# Patient Record
Sex: Male | Born: 1970
Health system: Southern US, Community
[De-identification: ages and names within clinical notes are randomized; demographics above are authoritative.]

## PROBLEM LIST (undated history)

## (undated) DIAGNOSIS — R079 Chest pain, unspecified: Secondary | ICD-10-CM

## (undated) HISTORY — PX: ROTATOR CUFF REPAIR: SHX139

## (undated) HISTORY — DX: Chest pain, unspecified: R07.9

---

## 2015-10-29 DIAGNOSIS — E7800 Pure hypercholesterolemia, unspecified: Secondary | ICD-10-CM

## 2015-10-29 DIAGNOSIS — E785 Hyperlipidemia, unspecified: Secondary | ICD-10-CM | POA: Insufficient documentation

## 2015-10-29 HISTORY — DX: Hyperlipidemia, unspecified: E78.5

## 2015-10-29 HISTORY — DX: Pure hypercholesterolemia, unspecified: E78.00

## 2018-04-22 ENCOUNTER — Emergency Department (HOSPITAL_BASED_OUTPATIENT_CLINIC_OR_DEPARTMENT_OTHER)
Admission: EM | Admit: 2018-04-22 | Discharge: 2018-04-22 | Disposition: A | Discharge status: Home / Self Care | Payer: Self-pay | Attending: Emergency Medicine | Admitting: Emergency Medicine

## 2018-04-22 ENCOUNTER — Encounter (HOSPITAL_BASED_OUTPATIENT_CLINIC_OR_DEPARTMENT_OTHER): Payer: Self-pay | Admitting: Emergency Medicine

## 2018-04-22 ENCOUNTER — Other Ambulatory Visit: Payer: Self-pay

## 2018-04-22 DIAGNOSIS — J069 Acute upper respiratory infection, unspecified: Secondary | ICD-10-CM | POA: Insufficient documentation

## 2018-04-22 DIAGNOSIS — B9789 Other viral agents as the cause of diseases classified elsewhere: Secondary | ICD-10-CM

## 2018-04-22 MED ORDER — AMOXICILLIN 500 MG PO CAPS: 500.0000 mg | ORAL_CAPSULE | Freq: Three times a day (TID) | ORAL | 0 refills | Status: AC

## 2018-04-22 MED ORDER — FEXOFENADINE-PSEUDOEPHED ER 60-120 MG PO TB12: 1.0000 | ORAL_TABLET | Freq: Two times a day (BID) | ORAL | 0 refills | Status: AC

## 2018-04-22 MED FILL — ALLEGRA-D 12 HOUR TABLET: 60-120 | 15 days supply | Qty: 30 | Fill #0

## 2018-04-22 MED FILL — AMOXICILLIN 500 MG CAPSULE: 500 | 7 days supply | Qty: 21 | Fill #0

## 2018-04-22 NOTE — ED Provider Notes (Signed)
[facility] EMERGENCY DEPARTMENT Provider Note   CSN: [id number] Arrival date & time: 04/22/18  0850     History   Chief Complaint Chief Complaint  Patient presents with  . Cough    HPI Ronnie Arellano is a 47 y.o. male.  HPI   47 year old male with cough and congestion.  Symptom onset Friday.  Persistent since then.  Is been taken Mucinex with no improvement.  He feels like he cannot get up the stuff in his chest.  No fevers or chills.  Some facial pressure.  No unusual leg pain or swelling.  No orthopnea.  History reviewed. No pertinent past medical history.  There are no active problems to display for this patient.      Home Medications    Prior to Admission medications   Medication Sig Start Date End Date Taking? Authorizing Provider  atorvastatin (LIPITOR) 10 MG tablet Take 10 mg by mouth daily.   Yes [provider]    Family History History reviewed. No pertinent family history.  Social History Social History   Tobacco Use  . Smoking status: Not on file  Substance Use Topics  . Alcohol use: Not on file  . Drug use: Not on file     Allergies   Patient has no known allergies.   Review of Systems Review of Systems All systems reviewed and negative, other than as noted in HPI.   Physical Exam Updated Vital Signs BP (!) 143/101 (BP Location: Right Arm)   Pulse 73   Temp 97.8 F (36.6 C) (Oral)   Resp 16   Ht  (1.778 m)   Wt 95.3 kg (210 lb)   SpO2 100%   BMI 30.13 kg/m   Physical Exam  Constitutional: He appears well-developed and well-nourished. No distress.  HENT:  Head: Normocephalic and atraumatic.  Eyes: Conjunctivae are normal. Right eye exhibits no discharge. Left eye exhibits no discharge.  Neck: Neck supple.  Cardiovascular: Normal rate, regular rhythm and normal heart sounds. Exam reveals no gallop and no friction rub.  No murmur heard. Pulmonary/Chest: Effort normal and breath sounds normal. No  respiratory distress.  Abdominal: Soft. He exhibits no distension. There is no tenderness.  Musculoskeletal: He exhibits no edema or tenderness.  Neurological: He is alert.  Skin: Skin is warm and dry.  Psychiatric: He has a normal mood and affect. His behavior is normal. Thought content normal.  Nursing note and vitals reviewed.    ED Treatments / Results  Labs (all labs ordered are listed, but only abnormal results are displayed) Labs Reviewed - No data to display  EKG None  Radiology No results found.  Procedures Procedures (including critical care time)  Medications Ordered in ED Medications - No data to display   Initial Impression / Assessment and Plan / ED Course  I have reviewed the triage vital signs and the nursing notes.  Pertinent labs & imaging results that were available during my care of the patient were reviewed by me and considered in my medical decision making (see chart for details).     47 year old male with likely viral URI.  He is afebrile.  Generally well-appearing.  Lungs are clear.  Oxygen saturation 90% on room air.  He has no increased work of breathing.  I doubt emergent process such as serious bacterial infection, PE, etc.  Plan symptomatic treatment.  Patient is concerned that he needs an antibiotic " to just go ahead and cleared it up." Advised I suspect  viral and no role for antibiotics. Encouraged to not fill unless symptoms do not begin to improve in another 3-4 days. Return precautions discussed  Final Clinical Impressions(s) / ED Diagnoses   Final diagnoses:  Viral URI with cough    ED Discharge Orders    None       Raeford Razor, MD 04/27/18 [phone]

## 2018-04-22 NOTE — ED Triage Notes (Signed)
Patient c/o cough and chest congestion since Friday.   

## 2018-08-03 ENCOUNTER — Emergency Department (HOSPITAL_BASED_OUTPATIENT_CLINIC_OR_DEPARTMENT_OTHER): Payer: BLUE CROSS/BLUE SHIELD

## 2018-08-03 ENCOUNTER — Emergency Department (HOSPITAL_BASED_OUTPATIENT_CLINIC_OR_DEPARTMENT_OTHER)
Admission: EM | Admit: 2018-08-03 | Discharge: 2018-08-03 | Disposition: A | Discharge status: Home / Self Care | Payer: BLUE CROSS/BLUE SHIELD | Attending: Emergency Medicine | Admitting: Emergency Medicine

## 2018-08-03 ENCOUNTER — Encounter (HOSPITAL_BASED_OUTPATIENT_CLINIC_OR_DEPARTMENT_OTHER): Payer: Self-pay | Admitting: Adult Health

## 2018-08-03 ENCOUNTER — Other Ambulatory Visit: Payer: Self-pay

## 2018-08-03 DIAGNOSIS — S59901A Unspecified injury of right elbow, initial encounter: Secondary | ICD-10-CM | POA: Diagnosis not present

## 2018-08-03 DIAGNOSIS — Z79899 Other long term (current) drug therapy: Secondary | ICD-10-CM | POA: Diagnosis not present

## 2018-08-03 DIAGNOSIS — Y999 Unspecified external cause status: Secondary | ICD-10-CM | POA: Diagnosis not present

## 2018-08-03 DIAGNOSIS — W010XXA Fall on same level from slipping, tripping and stumbling without subsequent striking against object, initial encounter: Secondary | ICD-10-CM | POA: Diagnosis not present

## 2018-08-03 DIAGNOSIS — S46391A Other injury of muscle, fascia and tendon of triceps, right arm, initial encounter: Secondary | ICD-10-CM | POA: Insufficient documentation

## 2018-08-03 DIAGNOSIS — Y9301 Activity, walking, marching and hiking: Secondary | ICD-10-CM | POA: Diagnosis not present

## 2018-08-03 DIAGNOSIS — Y929 Unspecified place or not applicable: Secondary | ICD-10-CM | POA: Diagnosis not present

## 2018-08-03 DIAGNOSIS — S40921A Unspecified superficial injury of right upper arm, initial encounter: Secondary | ICD-10-CM | POA: Diagnosis present

## 2018-08-03 DIAGNOSIS — S46301A Unspecified injury of muscle, fascia and tendon of triceps, right arm, initial encounter: Secondary | ICD-10-CM

## 2018-08-03 MED ORDER — MELOXICAM 15 MG PO TABS: 15.0000 mg | ORAL_TABLET | Freq: Every day | ORAL | 0 refills | Status: DC

## 2018-08-03 NOTE — ED Triage Notes (Signed)
PResents with right arm pain that occurred after slipping and hitting his right arm on the wood part of a bridge while playing golf this AM. HE states the pain is from the tricep to the elbow and worse with lifting arm.

## 2018-08-03 NOTE — ED Provider Notes (Signed)
[facility] EMERGENCY DEPARTMENT Provider Note   CSN: [id number] Arrival date & time: 08/03/18  1216     History   Chief Complaint Chief Complaint  Patient presents with  . Arm Pain    HPI Ronnie Arellano is a 47 y.o. male presents the emergency department chief complaint of elbow injury.  Patient states that he was walking on a bridge when his feet slipped out from under him and he landed on the right side slipped banging his elbow.  Patient had immediate pain and swelling and complains of significant pain in the tricep.  He is able to move his arm however has severe pain.  He complains of some numbness in all of his fingers.  He has no previous injuries to this area.  HPI  History reviewed. No pertinent past medical history.  There are no active problems to display for this patient.   History reviewed. No pertinent surgical history.      Home Medications    Prior to Admission medications   Medication Sig Start Date End Date Taking? Authorizing Provider  amoxicillin (AMOXIL) 500 MG capsule Take 1 capsule (500 mg total) by mouth 3 (three) times daily. 04/22/18   Raeford Razor, MD  atorvastatin (LIPITOR) 10 MG tablet Take 10 mg by mouth daily.    [provider]  fexofenadine-pseudoephedrine (ALLEGRA-D) 60-120 MG 12 hr tablet Take 1 tablet by mouth every 12 (twelve) hours. 04/22/18   Raeford Razor, MD  meloxicam (MOBIC) 15 MG tablet Take 1 tablet (15 mg total) by mouth daily. Take 1 daily with food. 08/03/18   Arthor Captain, PA-C    Family History History reviewed. No pertinent family history.  Social History Social History   Tobacco Use  . Smoking status: Not on file  Substance Use Topics  . Alcohol use: Not on file  . Drug use: Not on file     Allergies   Patient has no known allergies.   Review of Systems Review of Systems Ten systems reviewed and are negative for acute change, except as noted in the HPI.    Physical Exam Updated  Vital Signs BP (!) 139/95   Pulse 68   Temp 98.3 F (36.8 C) (Oral)   Resp 18   Ht 5\' 10"  (1.778 m)   Wt 95.3 kg   SpO2 100%   BMI 30.13 kg/m   Physical Exam  Constitutional: He appears well-developed and well-nourished. No distress.  HENT:  Head: Normocephalic and atraumatic.  Eyes: Conjunctivae are normal. No scleral icterus.  Neck: Normal range of motion. Neck supple.  Cardiovascular: Normal rate, regular rhythm and normal heart sounds.  Pulmonary/Chest: Effort normal and breath sounds normal. No respiratory distress.  Abdominal: Soft. There is no tenderness.  Musculoskeletal: He exhibits no edema.  Tenderness and swelling over the right olecranon, tenderness and swelling over the distal end of the right tricep which is exquisitely tender to palpation.  Pain with active flexion.  Patient is able to fully extend.  Severe pain with flexion against resistance and patient quickly terminated trying to do this.  Neurological: He is alert.  Skin: Skin is warm and dry. He is not diaphoretic.  Psychiatric: His behavior is normal.  Nursing note and vitals reviewed.    ED Treatments / Results  Labs (all labs ordered are listed, but only abnormal results are displayed) Labs Reviewed - No data to display  EKG None  Radiology Dg Humerus Right  Result Date: 08/03/2018 CLINICAL DATA:  Larey Seat  today with posterior upper arm pain. EXAM: RIGHT HUMERUS - 2+ VIEW COMPARISON:  None. FINDINGS: There is no evidence of fracture or other focal bone lesions. Soft tissues are unremarkable. IMPRESSION: Normal radiographs. Electronically Signed   By: Paulina FusiMark  Shogry M.D.   On: 08/03/2018 13:17    Procedures Procedures (including critical care time)  Medications Ordered in ED Medications - No data to display   Initial Impression / Assessment and Plan / ED Course  I have reviewed the triage vital signs and the nursing notes.  Pertinent labs & imaging results that were available during my care of  the patient were reviewed by me and considered in my medical decision making (see chart for details).     She with injury to the right elbow and triceps region.  The x-ray was negative for any acute abnormalities and I personally reviewed the film.  There is no fat pad sign or suggestion of radial head fracture.  I do have concern for a potential partial triceps tendon rupture given mechanism of injury, significant swelling and severe pain with extension of the arm against resistance.  I do not feel the patient has any significant nerve injury although he does have swelling in the region of the ulnar nerve.  Other considerations include expanding hematoma as well as traumatic olecranon bursitis.  Currently the patient will be placed in Ace wrap and sling.  He is advised to apply ice frequently and given anti-inflammatories.  Patient should follow closely with outpatient sports medicine or orthopedics.  He may need further imaging.  I discussed the case with Dr. Gwenlyn FudgeGoldstein who is agreement with work-up and plan for discharge.  Final Clinical Impressions(s) / ED Diagnoses   Final diagnoses:  Elbow injury, right, initial encounter  Unspecified injury of muscle, fascia and tendon of triceps, right arm, initial encounter    ED Discharge Orders         Ordered    meloxicam (MOBIC) 15 MG tablet  Daily     08/03/18 1437    Apply wrap     08/03/18 1437           Arthor CaptainHarris, Alyrica Thurow, PA-C 08/03/18 1521    Pricilla LovelessGoldston, Scott, MD 08/04/18 [phone]

## 2018-08-03 NOTE — Discharge Instructions (Addendum)
Apply ice to the affected elbow at least 5 times a day.  Use an ice pack with a barrier like a towel between the skin and ice and leave on for 20 minutes at a time.  Return to the emergency department if your pain is severe, uncontrolled or you have any new or worsening symptoms. Otherwise please call Dr. Pearletha ForgeHudnall first thing Monday morning to schedule an ER follow-up for your injury.

## 2019-05-01 IMAGING — DX DG HUMERUS 2V *R*
2 series · 2 of 2 positions shown · non-contrast
Comparison: None.

CLINICAL DATA: Fell today with posterior upper arm pain.

EXAM:
RIGHT HUMERUS - 2+ VIEW

[humerus ap]
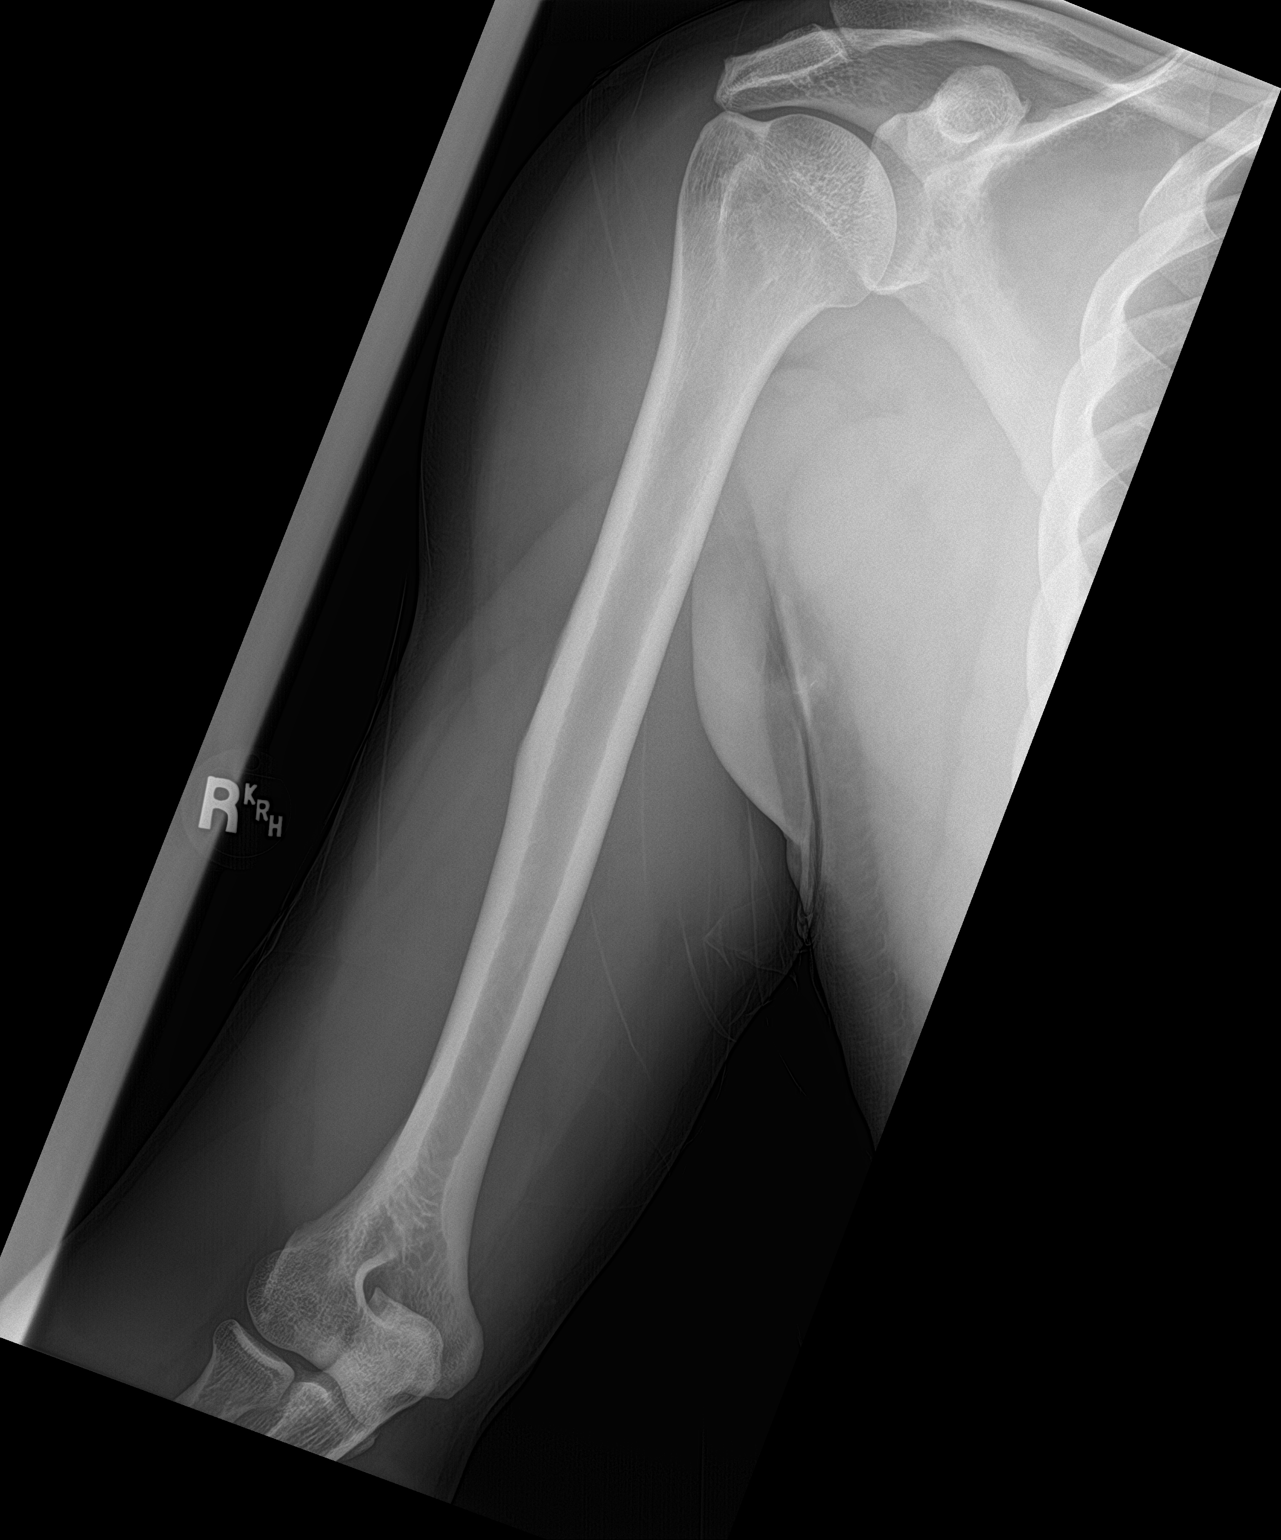

[humerus lat]
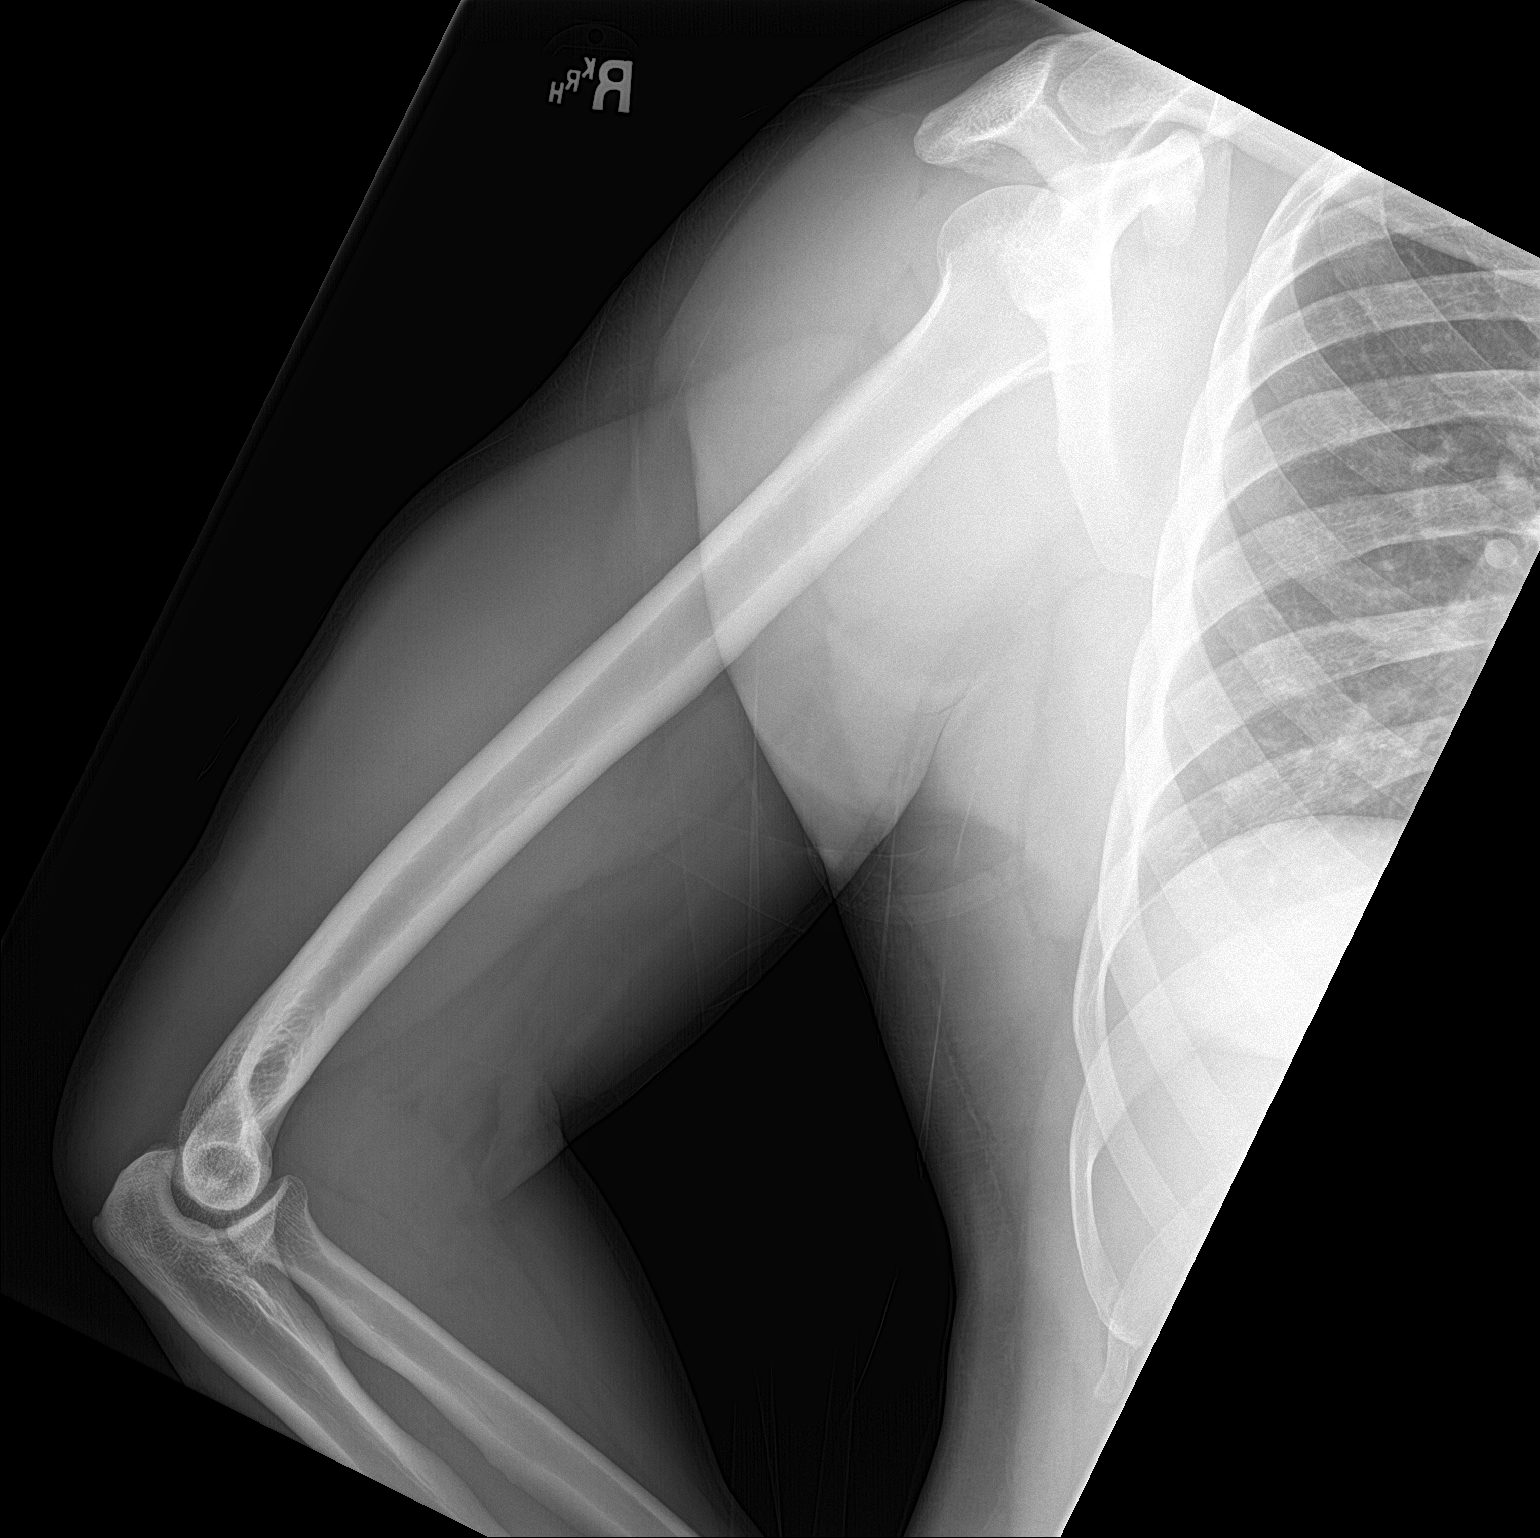

[2 of 2 positions shown; findings below may reference images not displayed]

FINDINGS: There is no evidence of fracture or other focal bone lesions. Soft
tissues are unremarkable.
IMPRESSION: Normal radiographs.

## 2019-08-12 DIAGNOSIS — E669 Obesity, unspecified: Secondary | ICD-10-CM

## 2019-08-12 DIAGNOSIS — D519 Vitamin B12 deficiency anemia, unspecified: Secondary | ICD-10-CM | POA: Insufficient documentation

## 2019-08-12 HISTORY — DX: Vitamin B12 deficiency anemia, unspecified: D51.9

## 2019-08-12 HISTORY — DX: Obesity, unspecified: E66.9

## 2019-08-15 DIAGNOSIS — R7303 Prediabetes: Secondary | ICD-10-CM | POA: Insufficient documentation

## 2019-08-15 HISTORY — DX: Prediabetes: R73.03

## 2020-10-30 ENCOUNTER — Emergency Department (HOSPITAL_BASED_OUTPATIENT_CLINIC_OR_DEPARTMENT_OTHER): Payer: Worker's Compensation

## 2020-10-30 ENCOUNTER — Emergency Department (HOSPITAL_BASED_OUTPATIENT_CLINIC_OR_DEPARTMENT_OTHER)
Admission: EM | Admit: 2020-10-30 | Discharge: 2020-10-30 | Disposition: A | Discharge status: Home / Self Care | Payer: Worker's Compensation | Attending: Emergency Medicine | Admitting: Emergency Medicine

## 2020-10-30 ENCOUNTER — Encounter (HOSPITAL_BASED_OUTPATIENT_CLINIC_OR_DEPARTMENT_OTHER): Payer: Self-pay | Admitting: Emergency Medicine

## 2020-10-30 DIAGNOSIS — X500XXA Overexertion from strenuous movement or load, initial encounter: Secondary | ICD-10-CM | POA: Insufficient documentation

## 2020-10-30 DIAGNOSIS — S46211A Strain of muscle, fascia and tendon of other parts of biceps, right arm, initial encounter: Secondary | ICD-10-CM | POA: Diagnosis not present

## 2020-10-30 DIAGNOSIS — Y990 Civilian activity done for income or pay: Secondary | ICD-10-CM | POA: Insufficient documentation

## 2020-10-30 DIAGNOSIS — S4990XA Unspecified injury of shoulder and upper arm, unspecified arm, initial encounter: Secondary | ICD-10-CM | POA: Diagnosis present

## 2020-10-30 NOTE — ED Provider Notes (Signed)
[facility] EMERGENCY DEPARTMENT Provider Note   CSN: [id number] Arrival date & time: 10/30/20  1322     History Chief Complaint  Patient presents with  . Shoulder Pain    Tiyon Sanor is a 49 y.o. male.  The history is provided by the patient and medical records.  Shoulder Pain  Dacen Frayre is a 49 y.o. male who presents to the Emergency Department complaining of shoulder pain. He presents the emergency department for evaluation of abrupt onset shoulder pain that started when he was lifting a couch. He is ambidextrous. He was picking up a couch and felt a pop in his anterior right shoulder. He has mild pain at rest but has significant pain with flexion, pronation of the right upper extremity. Pain is located in the right upper arm and anterior shoulder. He has no medical problems. No additional injuries.    History reviewed. No pertinent past medical history.  There are no problems to display for this patient.   No past surgical history on file.     No family history on file.  Social History   Tobacco Use  . Smoking status: Never Smoker  . Smokeless tobacco: Never Used  Substance Use Topics  . Alcohol use: Not on file  . Drug use: Not on file    Home Medications Prior to Admission medications   Medication Sig Start Date End Date Taking? Authorizing Provider  amoxicillin (AMOXIL) 500 MG capsule Take 1 capsule (500 mg total) by mouth 3 (three) times daily. 04/22/18   Raeford Razor, MD  atorvastatin (LIPITOR) 10 MG tablet Take 10 mg by mouth daily.    [provider]  fexofenadine-pseudoephedrine (ALLEGRA-D) 60-120 MG 12 hr tablet Take 1 tablet by mouth every 12 (twelve) hours. 04/22/18   Raeford Razor, MD  meloxicam (MOBIC) 15 MG tablet Take 1 tablet (15 mg total) by mouth daily. Take 1 daily with food. 08/03/18   Arthor Captain, PA-C    Allergies    Patient has no known allergies.  Review of Systems   Review of Systems  All other systems  reviewed and are negative.   Physical Exam Updated Vital Signs BP (!) 150/88 (BP Location: Left Arm)   Pulse 80   Temp 98.4 F (36.9 C) (Oral)   Resp 16   SpO2 99%   Physical Exam Vitals and nursing note reviewed.  Constitutional:      Appearance: He is well-developed.  HENT:     Head: Normocephalic and atraumatic.  Cardiovascular:     Rate and Rhythm: Normal rate and regular rhythm.  Pulmonary:     Effort: Pulmonary effort is normal. No respiratory distress.     Breath sounds: Normal breath sounds.  Musculoskeletal:     Comments: 2+ radial pulses bilaterally. There is no bony tenderness to the right upper extremity. Five out of five strength in the right hand and wrist. There is pain with supination and pronation of the right arm. There is weakness and pain with flexion at the elbow.  There is asymmetry of the biceps muscle between RUE and LUE.   Skin:    General: Skin is warm and dry.  Neurological:     Mental Status: He is alert and oriented to person, place, and time.  Psychiatric:        Behavior: Behavior normal.     ED Results / Procedures / Treatments   Labs (all labs ordered are listed, but only abnormal results are displayed) Labs Reviewed -  No data to display  EKG None  Radiology DG Shoulder Right  Result Date: 10/30/2020 CLINICAL DATA:  Anterior shoulder pain, lifting injury EXAM: RIGHT SHOULDER - 2+ VIEW COMPARISON:  None. FINDINGS: There is no evidence of fracture or dislocation. There is no evidence of arthropathy or other focal bone abnormality. Soft tissues are unremarkable. IMPRESSION: No acute osseous finding Electronically Signed   By: Judie Petit.  Shick M.D.   On: 10/30/2020 14:17    Procedures Procedures (including critical care time)  Medications Ordered in ED Medications - No data to display  ED Course  I have reviewed the triage vital signs and the nursing notes.  Pertinent labs & imaging results that were available during my care of the  patient were reviewed by me and considered in my medical decision making (see chart for details).    MDM Rules/Calculators/A&P                         patient here for evaluation of right shoulder pain after lifting injury. Examination is concerning for a proximal head of the biceps tear. The tear does not appear to be complete. Will place in sling for comfort. Discussed orthopedics follow-up.  Final Clinical Impression(s) / ED Diagnoses Final diagnoses:  Traumatic partial tear of right biceps tendon, initial encounter    Rx / DC Orders ED Discharge Orders    None       Tilden Fossa, MD 10/30/20 [phone]

## 2020-10-30 NOTE — ED Triage Notes (Signed)
He tells [place] that, as he was lifting a heavy couch with a so-worker today, he felt a sudden "pop" with a flash of pain at right ant. Shoulder area, which persists. He also states the pain is radiating down right inner upper arm area. He is in no distress.

## 2021-01-31 DIAGNOSIS — M75101 Unspecified rotator cuff tear or rupture of right shoulder, not specified as traumatic: Secondary | ICD-10-CM | POA: Insufficient documentation

## 2021-01-31 HISTORY — DX: Unspecified rotator cuff tear or rupture of right shoulder, not specified as traumatic: M75.101

## 2021-02-15 DIAGNOSIS — N529 Male erectile dysfunction, unspecified: Secondary | ICD-10-CM | POA: Insufficient documentation

## 2021-02-15 HISTORY — DX: Male erectile dysfunction, unspecified: N52.9

## 2021-08-25 DIAGNOSIS — N289 Disorder of kidney and ureter, unspecified: Secondary | ICD-10-CM | POA: Insufficient documentation

## 2021-08-25 HISTORY — DX: Disorder of kidney and ureter, unspecified: N28.9

## 2022-10-30 DIAGNOSIS — M51369 Other intervertebral disc degeneration, lumbar region without mention of lumbar back pain or lower extremity pain: Secondary | ICD-10-CM | POA: Insufficient documentation

## 2022-10-30 DIAGNOSIS — M5416 Radiculopathy, lumbar region: Secondary | ICD-10-CM

## 2022-10-30 HISTORY — DX: Other intervertebral disc degeneration, lumbar region without mention of lumbar back pain or lower extremity pain: M51.369

## 2022-10-30 HISTORY — DX: Radiculopathy, lumbar region: M54.16

## 2022-12-15 DIAGNOSIS — M47816 Spondylosis without myelopathy or radiculopathy, lumbar region: Secondary | ICD-10-CM

## 2022-12-15 DIAGNOSIS — G5732 Lesion of lateral popliteal nerve, left lower limb: Secondary | ICD-10-CM

## 2022-12-15 HISTORY — DX: Spondylosis without myelopathy or radiculopathy, lumbar region: M47.816

## 2022-12-15 HISTORY — DX: Lesion of lateral popliteal nerve, left lower limb: G57.32

## 2023-10-09 ENCOUNTER — Emergency Department (HOSPITAL_BASED_OUTPATIENT_CLINIC_OR_DEPARTMENT_OTHER)
Admission: EM | Admit: 2023-10-09 | Discharge: 2023-10-09 | Disposition: A | Discharge status: Home / Self Care | Payer: 59 | Attending: Emergency Medicine | Admitting: Emergency Medicine

## 2023-10-09 ENCOUNTER — Emergency Department (HOSPITAL_BASED_OUTPATIENT_CLINIC_OR_DEPARTMENT_OTHER): Payer: 59

## 2023-10-09 ENCOUNTER — Other Ambulatory Visit: Payer: Self-pay

## 2023-10-09 ENCOUNTER — Encounter (HOSPITAL_BASED_OUTPATIENT_CLINIC_OR_DEPARTMENT_OTHER): Payer: Self-pay | Admitting: Emergency Medicine

## 2023-10-09 DIAGNOSIS — R072 Precordial pain: Secondary | ICD-10-CM | POA: Insufficient documentation

## 2023-10-09 LAB — CBC
HCT: 43.9 % (ref 39.0–52.0)
Hemoglobin: 13.7 g/dL (ref 13.0–17.0)
MCH: 22.5 pg — ABNORMAL LOW (ref 26.0–34.0)
MCHC: 31.2 g/dL (ref 30.0–36.0)
MCV: 72 fL — ABNORMAL LOW (ref 80.0–100.0)
Platelets: 246 10*3/uL (ref 150–400)
RBC: 6.1 MIL/uL — ABNORMAL HIGH (ref 4.22–5.81)
RDW: 17 % — ABNORMAL HIGH (ref 11.5–15.5)
WBC: 6.8 10*3/uL (ref 4.0–10.5)
nRBC: 0 % (ref 0.0–0.2)

## 2023-10-09 LAB — BASIC METABOLIC PANEL
Anion gap: 13 (ref 5–15)
BUN: 11 mg/dL (ref 6–20)
CO2: 24 mmol/L (ref 22–32)
Calcium: 9.5 mg/dL (ref 8.9–10.3)
Chloride: 99 mmol/L (ref 98–111)
Creatinine, Ser: 1.02 mg/dL (ref 0.61–1.24)
GFR, Estimated: >60 mL/min (ref >60)
Glucose, Bld: 105 mg/dL — ABNORMAL HIGH (ref 70–99)
Potassium: 3.9 mmol/L (ref 3.5–5.1)
Sodium: 136 mmol/L (ref 135–145)

## 2023-10-09 LAB — TROPONIN I (HIGH SENSITIVITY)
Troponin I (High Sensitivity): 2 ng/L (ref <18)
Troponin I (High Sensitivity): 4 ng/L (ref <18)

## 2023-10-09 MED ORDER — MELOXICAM 15 MG PO TABS: 15.0000 mg | ORAL_TABLET | Freq: Every day | ORAL | 0 refills | Status: DC

## 2023-10-09 MED ORDER — KETOROLAC TROMETHAMINE 30 MG/ML IJ SOLN
30.0000 mg | Freq: Once | INTRAMUSCULAR | Status: AC
  Administered 2023-10-09: 30 mg via INTRAVENOUS
  Filled 2023-10-09: qty 1

## 2023-10-09 MED ORDER — DICLOFENAC SODIUM 1 % EX GEL: 2.0000 g | Freq: Four times a day (QID) | CUTANEOUS | 0 refills | Status: AC | PRN

## 2023-10-09 MED ORDER — DICLOFENAC SODIUM 1 % EX GEL: 2.0000 g | Freq: Four times a day (QID) | CUTANEOUS | 0 refills | Status: DC | PRN

## 2023-10-09 MED ORDER — MELOXICAM 15 MG PO TABS
15.0000 mg | ORAL_TABLET | Freq: Every day | ORAL | 0 refills | Status: AC
Start: 2023-10-09 — End: 2023-10-16

## 2023-10-09 NOTE — ED Triage Notes (Signed)
Pt reports LT CP that started this morning when he bent over to tie his shoe; lasted all day, he thought it was gas; became worse when he was working out [employer]

## 2023-10-09 NOTE — ED Provider Notes (Signed)
Emergency Department Provider Note   I have reviewed the triage vital signs and the nursing notes.   HISTORY  Chief Complaint Chest Pain   HPI Ronnie Arellano is a 52 y.o. male past history of hyperlipidemia presents to the emergency department for evaluation of left chest discomfort.  Pain began this morning when he bent over to tie his shoe.  He said constant pain throughout the day with intermittent worsening discomfort.  Somewhat worse with movement but also went into an interval training class and had to stop 10 minutes and due to chest discomfort.  He has not had associated diaphoresis, nausea, vomiting.  No prior history of heart attack.  No first-degree relatives with heart attack.  He is not diabetic or hypertensive.  No injury or new workout routines.   History reviewed. No pertinent past medical history.  Review of Systems  Constitutional: No fever/chills Cardiovascular: Positive chest pain. Respiratory: Denies shortness of breath. Gastrointestinal: No abdominal pain.  No nausea, no vomiting.  Musculoskeletal: Negative for back pain. Skin: Negative for rash. Neurological: Negative for headaches.  ____________________________________________   PHYSICAL EXAM:  VITAL SIGNS: ED Triage Vitals  Encounter Vitals Group     BP 10/09/23 1843 (!) 147/101     Pulse Rate 10/09/23 1843 100     Resp 10/09/23 1843 18     Temp 10/09/23 1843 97.9 F (36.6 C)     Temp src --      SpO2 10/09/23 1843 100 %     Weight 10/09/23 1841 230 lb (104.3 kg)     Height 10/09/23 1841 5\' 10"  (1.778 m)   Constitutional: Alert and oriented. Well appearing and in no acute distress. Eyes: Conjunctivae are normal.  Head: Atraumatic. Nose: No congestion/rhinnorhea. Mouth/Throat: Mucous membranes are moist.  Neck: No stridor. Cardiovascular: Normal rate, regular rhythm. Good peripheral circulation. Grossly normal heart sounds.   Respiratory: Normal respiratory effort.  No retractions. Lungs  CTAB. Gastrointestinal: Soft and nontender. No distention.  Musculoskeletal: No lower extremity tenderness nor edema. No gross deformities of extremities.  Some mild tenderness to palpation of the left chest wall just under the left breast which reproduces the patient's discomfort. Neurologic:  Normal speech and language. No gross focal neurologic deficits are appreciated.  Skin:  Skin is warm, dry and intact. No rash noted.  ____________________________________________   LABS (all labs ordered are listed, but only abnormal results are displayed)  Labs Reviewed  BASIC METABOLIC PANEL - Abnormal; Notable for the following components:      Result Value   Glucose, Bld 105 (*)    All other components within normal limits  CBC - Abnormal; Notable for the following components:   RBC 6.10 (*)    MCV 72.0 (*)    MCH 22.5 (*)    RDW 17.0 (*)    All other components within normal limits  TROPONIN I (HIGH SENSITIVITY)  TROPONIN I (HIGH SENSITIVITY)   ____________________________________________  EKG   EKG Interpretation Date/Time:  Tuesday October 09 2023 18:43:49 EDT Ventricular Rate:  95 PR Interval:  172 QRS Duration:  84 QT Interval:  346 QTC Calculation: 434 R Axis:   -34  Text Interpretation: Normal sinus rhythm Left axis deviation Possible Lateral infarct , age undetermined Abnormal ECG No previous ECGs available Confirmed by Alona Bene [phone]) on 10/09/2023 6:59:46 PM        ____________________________________________  RADIOLOGY  DG Chest 2 View  Result Date: 10/09/2023 CLINICAL DATA:  Chest pain EXAM: CHEST -  2 VIEW COMPARISON:  None Available. FINDINGS: The heart size and mediastinal contours are within normal limits. Both lungs are clear. The visualized skeletal structures are unremarkable. IMPRESSION: No active cardiopulmonary disease. Electronically Signed   By: Darliss Cheney M.D.   On: 10/09/2023 22:06     ____________________________________________   PROCEDURES  Procedure(s) performed:   Procedures  None  ____________________________________________   INITIAL IMPRESSION / ASSESSMENT AND PLAN / ED COURSE  Pertinent labs & imaging results that were available during my care of the patient were reviewed by me and considered in my medical decision making (see chart for details).   This patient is Presenting for Evaluation of CP, which does require a range of treatment options, and is a complaint that involves a high risk of morbidity and mortality.  The Differential Diagnoses includes but is not exclusive to acute coronary syndrome, aortic dissection, pulmonary embolism, cardiac tamponade, community-acquired pneumonia, pericarditis, musculoskeletal chest wall pain, etc.   Critical Interventions-    Medications  ketorolac (TORADOL) 30 MG/ML injection 30 mg (30 mg Intravenous Given 10/09/23 2030)    Reassessment after intervention: pain improved.    I did obtain Additional Historical Information from wife at bedside.    Clinical Laboratory Tests Ordered, included troponin negative.  No anemia or leukocytosis.  No acute kidney injury.  Radiologic Tests Ordered, included CXR. I independently interpreted the images and agree with radiology interpretation.   Cardiac Monitor Tracing which shows NSR.    Social Determinants of Health Risk patient is a non-smoker.   Medical Decision Making: Summary:  Patient presents emergency department with chest pain which seems likely musculoskeletal.  Plan for serial troponin and reassess.   Reevaluation with update and discussion with patient. Pain improved with Toradol. Second troponin is negative. Plan for Cardiology referral and strict ED return precautions.   Considered admission but workup is reassuring.   Patient's presentation is most consistent with acute presentation with potential threat to life or bodily function.    Disposition: discharge  ____________________________________________  FINAL CLINICAL IMPRESSION(S) / ED DIAGNOSES  Final diagnoses:  Precordial chest pain    NEW OUTPATIENT MEDICATIONS STARTED DURING THIS VISIT:  Discharge Medication List as of 10/09/2023 10:40 PM     START taking these medications   Details  diclofenac Sodium (VOLTAREN) 1 % GEL Apply 2 g topically 4 (four) times daily as needed., Starting Tue 10/09/2023, Normal        Note:  This document was prepared using Dragon voice recognition software and may include unintentional dictation errors.  Alona Bene, MD, [facility]    Marwin Primmer, Arlyss Repress, MD 10/09/23 2251

## 2023-10-09 NOTE — Discharge Instructions (Signed)

## 2023-10-15 ENCOUNTER — Other Ambulatory Visit: Payer: Self-pay

## 2023-10-15 DIAGNOSIS — R079 Chest pain, unspecified: Secondary | ICD-10-CM | POA: Insufficient documentation

## 2023-10-17 ENCOUNTER — Ambulatory Visit: Payer: 59 | Admitting: Cardiology
